# Patient Record
Sex: Male | Born: 1970 | Hispanic: Yes | Marital: Married | Smoking: Current every day smoker
Health system: Southern US, Community
[De-identification: ages and names within clinical notes are randomized; demographics above are authoritative.]

## PROBLEM LIST (undated history)

## (undated) DIAGNOSIS — I10 Essential (primary) hypertension: Secondary | ICD-10-CM

## (undated) DIAGNOSIS — E119 Type 2 diabetes mellitus without complications: Secondary | ICD-10-CM

## (undated) DIAGNOSIS — K219 Gastro-esophageal reflux disease without esophagitis: Secondary | ICD-10-CM

## (undated) HISTORY — PX: ABCESS DRAINAGE: SHX399

---

## 2015-08-30 ENCOUNTER — Emergency Department: Payer: Worker's Compensation

## 2015-08-30 ENCOUNTER — Encounter: Payer: Self-pay | Admitting: *Deleted

## 2015-08-30 ENCOUNTER — Emergency Department
Admission: EM | Admit: 2015-08-30 | Discharge: 2015-08-30 | Disposition: A | Discharge status: Home / Self Care | Payer: Worker's Compensation | Attending: Emergency Medicine | Admitting: Emergency Medicine

## 2015-08-30 DIAGNOSIS — Y998 Other external cause status: Secondary | ICD-10-CM | POA: Insufficient documentation

## 2015-08-30 DIAGNOSIS — F17210 Nicotine dependence, cigarettes, uncomplicated: Secondary | ICD-10-CM | POA: Diagnosis not present

## 2015-08-30 DIAGNOSIS — I10 Essential (primary) hypertension: Secondary | ICD-10-CM | POA: Diagnosis not present

## 2015-08-30 DIAGNOSIS — Y9389 Activity, other specified: Secondary | ICD-10-CM | POA: Insufficient documentation

## 2015-08-30 DIAGNOSIS — S42292A Other displaced fracture of upper end of left humerus, initial encounter for closed fracture: Secondary | ICD-10-CM | POA: Diagnosis not present

## 2015-08-30 DIAGNOSIS — S4992XA Unspecified injury of left shoulder and upper arm, initial encounter: Secondary | ICD-10-CM | POA: Diagnosis present

## 2015-08-30 DIAGNOSIS — E119 Type 2 diabetes mellitus without complications: Secondary | ICD-10-CM | POA: Insufficient documentation

## 2015-08-30 DIAGNOSIS — S59902A Unspecified injury of left elbow, initial encounter: Secondary | ICD-10-CM | POA: Diagnosis not present

## 2015-08-30 DIAGNOSIS — Y9289 Other specified places as the place of occurrence of the external cause: Secondary | ICD-10-CM | POA: Insufficient documentation

## 2015-08-30 DIAGNOSIS — W19XXXA Unspecified fall, initial encounter: Secondary | ICD-10-CM

## 2015-08-30 DIAGNOSIS — W000XXA Fall on same level due to ice and snow, initial encounter: Secondary | ICD-10-CM | POA: Diagnosis not present

## 2015-08-30 DIAGNOSIS — S42302A Unspecified fracture of shaft of humerus, left arm, initial encounter for closed fracture: Secondary | ICD-10-CM

## 2015-08-30 HISTORY — DX: Type 2 diabetes mellitus without complications: E11.9

## 2015-08-30 HISTORY — DX: Gastro-esophageal reflux disease without esophagitis: K21.9

## 2015-08-30 HISTORY — DX: Essential (primary) hypertension: I10

## 2015-08-30 MED ORDER — OXYCODONE-ACETAMINOPHEN 5-325 MG PO TABS
2.0000 | ORAL_TABLET | Freq: Once | ORAL | Status: AC
  Administered 2015-08-30: 2 via ORAL
  Filled 2015-08-30: qty 2

## 2015-08-30 MED ORDER — MORPHINE SULFATE (PF) 4 MG/ML IV SOLN
4.0000 mg | Freq: Once | INTRAVENOUS | Status: AC
  Administered 2015-08-30: 4 mg via INTRAVENOUS
  Filled 2015-08-30: qty 1

## 2015-08-30 MED ORDER — HYDROMORPHONE HCL 1 MG/ML IJ SOLN
1.0000 mg | Freq: Once | INTRAMUSCULAR | Status: AC
  Administered 2015-08-30: 1 mg via INTRAVENOUS
  Filled 2015-08-30: qty 1

## 2015-08-30 MED ORDER — OXYCODONE-ACETAMINOPHEN 5-325 MG PO TABS: 1.0000 | ORAL_TABLET | Freq: Four times a day (QID) | ORAL | Status: AC | PRN

## 2015-08-30 NOTE — ED Notes (Addendum)
Pt urine drug screen hand delivered to lab, and pt wc completed and copies given to pt for employer and himself.

## 2015-08-30 NOTE — ED Notes (Signed)
Pt arrived via EMS reporting reporting a fall on the ice today. Pt reports left sided shoulder pain and inability to move arm without 10/10 pain. Pt denies memory of hitting head and denies LOC. Pt denies pain anywhere other than shoulder at this time. Left shoulder appears to be higher than right and out of normal place.

## 2015-08-30 NOTE — ED Notes (Signed)
Radiology CD given to pt.

## 2015-08-30 NOTE — ED Notes (Signed)
Fax has failed again. Kathlene NovemberMike called for third time and reports no other fax line but that pts information can be emailed to [email or web address].

## 2015-08-30 NOTE — ED Provider Notes (Addendum)
[facility] Emergency Department Provider Note  Time seen: 11:31 AM  I have reviewed the triage vital signs and the nursing notes.   HISTORY  Chief Complaint Fall and Shoulder Pain    HPI Stanley Wilkinson is a 45 y.o. male with a past medical history of hypertension, diabetes, gastric reflux who presents the emergency department with left arm pain after fall. 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 Did not hit head. Denies LOC. Patient states immediate pain to the left shoulder and left elbow. Much worse with any attempted range of motion. Patient was able to get himself up, has been ambulatory since. He got back in his truck was able to drive to a safe location before calling EMS. Describes his pain as a 10/10. EMS dosed fentanyl, with some improvement per patient but remains in severe pain. Patient states the pain is mostly in the shoulder but does feel some discomfort in the elbow as well. Denies any other pain in his body. Denies headache, head pain, or neck or back pain.    Past Medical History  Diagnosis Date  . Hypertension   . Diabetes mellitus without complication (HCC)   . GERD (gastroesophageal reflux disease)     There are no active problems to display for this patient.   Past Surgical History  Procedure Laterality Date  . Abcess drainage      No current outpatient prescriptions on file.  Allergies Review of patient's allergies indicates not on file.  History reviewed. No pertinent family history.  Social History Social History  Substance Use Topics  . Smoking status: Current Every Day Smoker -- 0.50 packs/day    Types: Cigarettes  . Smokeless tobacco: None  . Alcohol Use: Yes     Comment: occasionally    Review of Systems Constitutional: Negative for fever Cardiovascular: Negative for chest pain. Respiratory: Negative for shortness of  breath. Gastrointestinal: Negative for abdominal pain Musculoskeletal: Negative for neck or back pain. Positive for left shoulder/left elbow pain Neurological: Negative for headache 10-point ROS otherwise negative.  ____________________________________________   PHYSICAL EXAM:  VITAL SIGNS: ED Triage Vitals  Enc Vitals Group     BP 08/30/15 1128 154/128 mmHg     Pulse Rate 08/30/15 1128 88     Resp 08/30/15 1128 16     Temp 08/30/15 1128 98.2 F (36.8 C)     Temp Source 08/30/15 1128 Oral     SpO2 08/30/15 1128 98 %     Weight 08/30/15 1128 213 lb (96.616 kg)     Height 08/30/15 1128 5\' 5"  (1.651 m)     Head Cir --      Peak Flow --      Pain Score 08/30/15 1129 5     Pain Loc --      Pain Edu? --      Excl. in GC? --     Constitutional: Alert and oriented. Well appearing and in mild distress due to pain Eyes: Normal exam ENT   Head: Normocephalic and atraumatic   Mouth/Throat: Mucous membranes are moist. Cardiovascular: Normal rate, regular rhythm.  Respiratory: Normal respiratory effort without tachypnea nor retractions. Breath sounds are clear and equal bilaterally. No wheezes/rales/rhonchi. Gastrointestinal: Soft and nontender. No distention.   Musculoskeletal: Significant tenderness to palpation of the left shoulder with moderate swelling noted. Mild tenderness palpation of left elbow. Neurovascularly intact distally with 2+ radial  pulse. Extremities are otherwise atraumatic. No C-spine tenderness. No other traumatic injuries identified. Neurologic:  Normal speech and language. No gross focal neurologic deficits  Skin:  Skin is warm, dry and intact.  Psychiatric: Mood and affect are normal. Speech and behavior are normal.   ____________________________________________    RADIOLOGY  Proximal humerus fracture. Posterior fat pad sign.  ____________________________________________   INITIAL IMPRESSION / ASSESSMENT AND PLAN / ED COURSE  Pertinent labs &  imaging results that were available during my care of the patient were reviewed by me and considered in my medical decision making (see chart for details).  Patient presents to the emergency department after a fall. Patient states significant left shoulder, with mild left elbow pain. No other injuries identified on exam. Patient denies pain in any other location. Patient has been ambulatory since the fall without difficulty. We will check x-rays and closely monitor in the emergency department. We will dose Dilaudid for pain control.  X-ray consistent with proximal humerus fracture, possible elbow fracture. We'll place in a splint and sling and have the patient follow with orthopedist [place] to follow-up with the orthopedist.  [facility] with pain medication. 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 He states he will either take the bus or fly home.  Arm remains neurovascularly intact status post splint. Patient states he feels much better after the splint was applied. We'll discharge a Percocet he will [place]. We have faxed a note explaining his symptoms and a fracture to his employer who will arrange [place]. ____________________________________________   FINAL CLINICAL IMPRESSION(S) / ED DIAGNOSES  Fall Left shoulder pain Possible humerus fracture  Minna AntisKevin Ivette Castronova, MD 08/30/15 1315  Minna AntisKevin Alexxia Stankiewicz, MD 08/30/15 1419

## 2015-08-30 NOTE — ED Notes (Signed)
This RN spoke to [employer] Brown regarding pts Workers comp. Complaint. Barbara CowerJason transferred RN to Pollie FriarMike Barker in [employer] who reported pt needed a urine drug screen for their workers comp. Pollie FriarMike Barker can be reached at phone: [phone] or fax: [phone]. Kathlene NovemberMike spoke to Viviano SimasLiz Gannon about DOT drug screens and it has been decided that since pt was not driving at the time a simple urine drug screen will be appropriate.

## 2015-08-30 NOTE — ED Notes (Signed)
Patient transported to X-ray 

## 2015-08-30 NOTE — Discharge Instructions (Signed)
Please follow-up with orthopedics as soon as possible for further evaluation.  Return to the emergency department for increased pain, numbness or coldness of the extremity, or any other symptom [place] concerning to yourself.    Humerus Fracture Treated With Immobilization The humerus is the large bone in your upper arm. You have a broken (fractured) humerus. These fractures are easily diagnosed with X-rays. TREATMENT  Simple fractures which will heal without disability are treated with simple immobilization. Immobilization means you will wear a cast, splint, or sling. You have a fracture which will do well with immobilization. The fracture will heal well simply by being held in a good position until it is stable enough to begin range of motion exercises. Do not take part in activities which would further injure your arm.  HOME CARE INSTRUCTIONS   Put ice on the injured area.  Put ice in a plastic bag.  Place a towel between your skin and the bag.  Leave the ice on for 15-20 minutes, 03-04 times a day.  If you have a cast:  Do not scratch the skin under the cast using sharp or pointed objects.  Check the skin around the cast every day. You may put lotion on any red or sore areas.  Keep your cast dry and clean.  If you have a splint:  Wear the splint as directed.  Keep your splint dry and clean.  You may loosen the elastic around the splint if your fingers become numb, tingle, or turn cold or blue.  If you have a sling:  Wear the sling as directed.  Do not put pressure on any part of your cast or splint until it is fully hardened.  Your cast or splint can be protected during bathing with a plastic bag. Do not lower the cast or splint into water.  Only take over-the-counter or prescription medicines for pain, discomfort, or fever as directed by your caregiver.  Do range of motion exercises as instructed by your caregiver.  Follow up as directed by your caregiver. This is  very important in order to avoid permanent injury or disability and chronic pain. SEEK IMMEDIATE MEDICAL CARE IF:   Your skin or nails in the injured arm turn blue or gray.  Your arm feels cold or numb.  You develop severe pain in the injured arm.  You are having problems with the medicines you were given. MAKE SURE YOU:   Understand these instructions.  Will watch your condition.  Will get help right away if you are not doing well or get worse.   This information is not intended to replace advice given to you by your health care provider. Make sure you discuss any questions you have with your health care provider.   Document Released: 11/12/2000 Document Revised: 08/27/2014 Document Reviewed: 12/29/2014 Elsevier Interactive Patient Education [employer]

## 2017-10-01 IMAGING — CR DG ELBOW 2V*L*
2 series · 2 of 2 positions shown · non-contrast
Comparison: None.

CLINICAL DATA: Fall on ice today

EXAM:
LEFT ELBOW - 2 VIEW

[elbow ap]
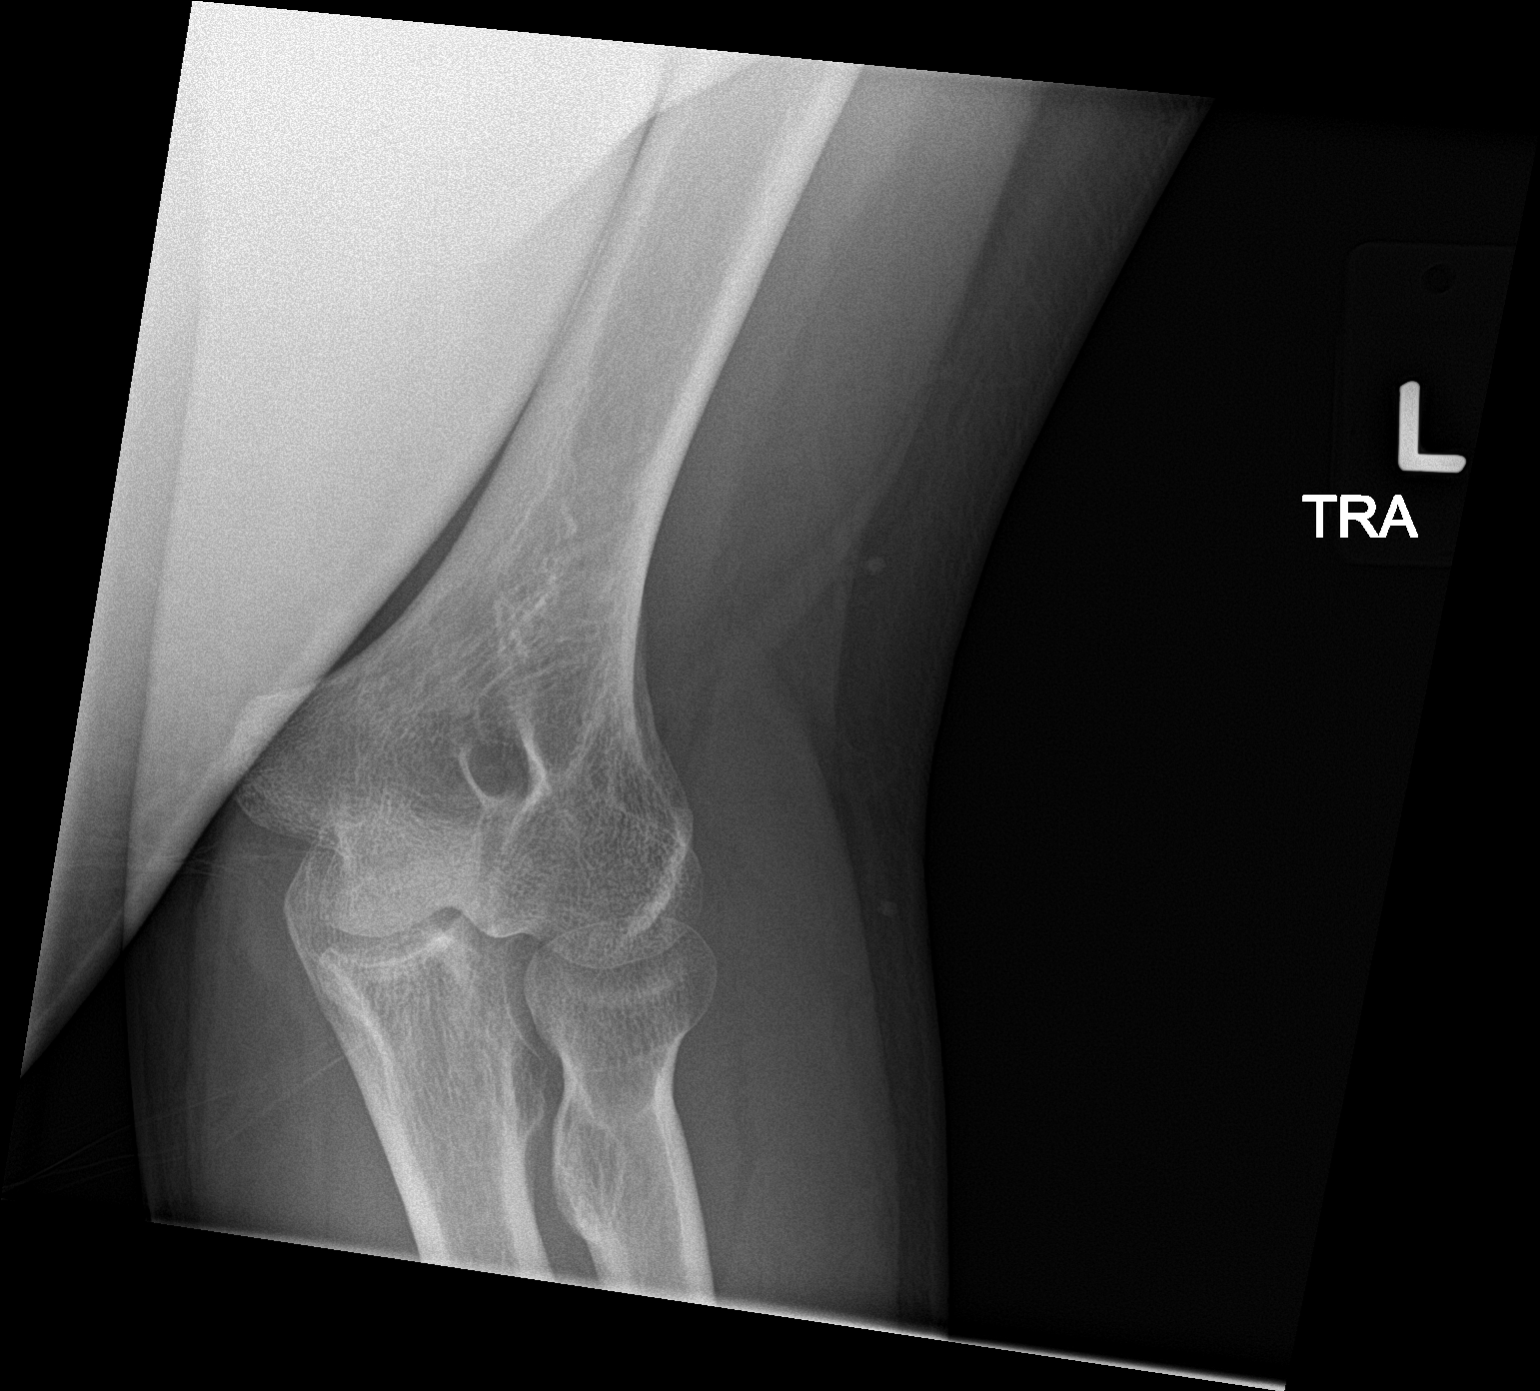

[elbow lat]
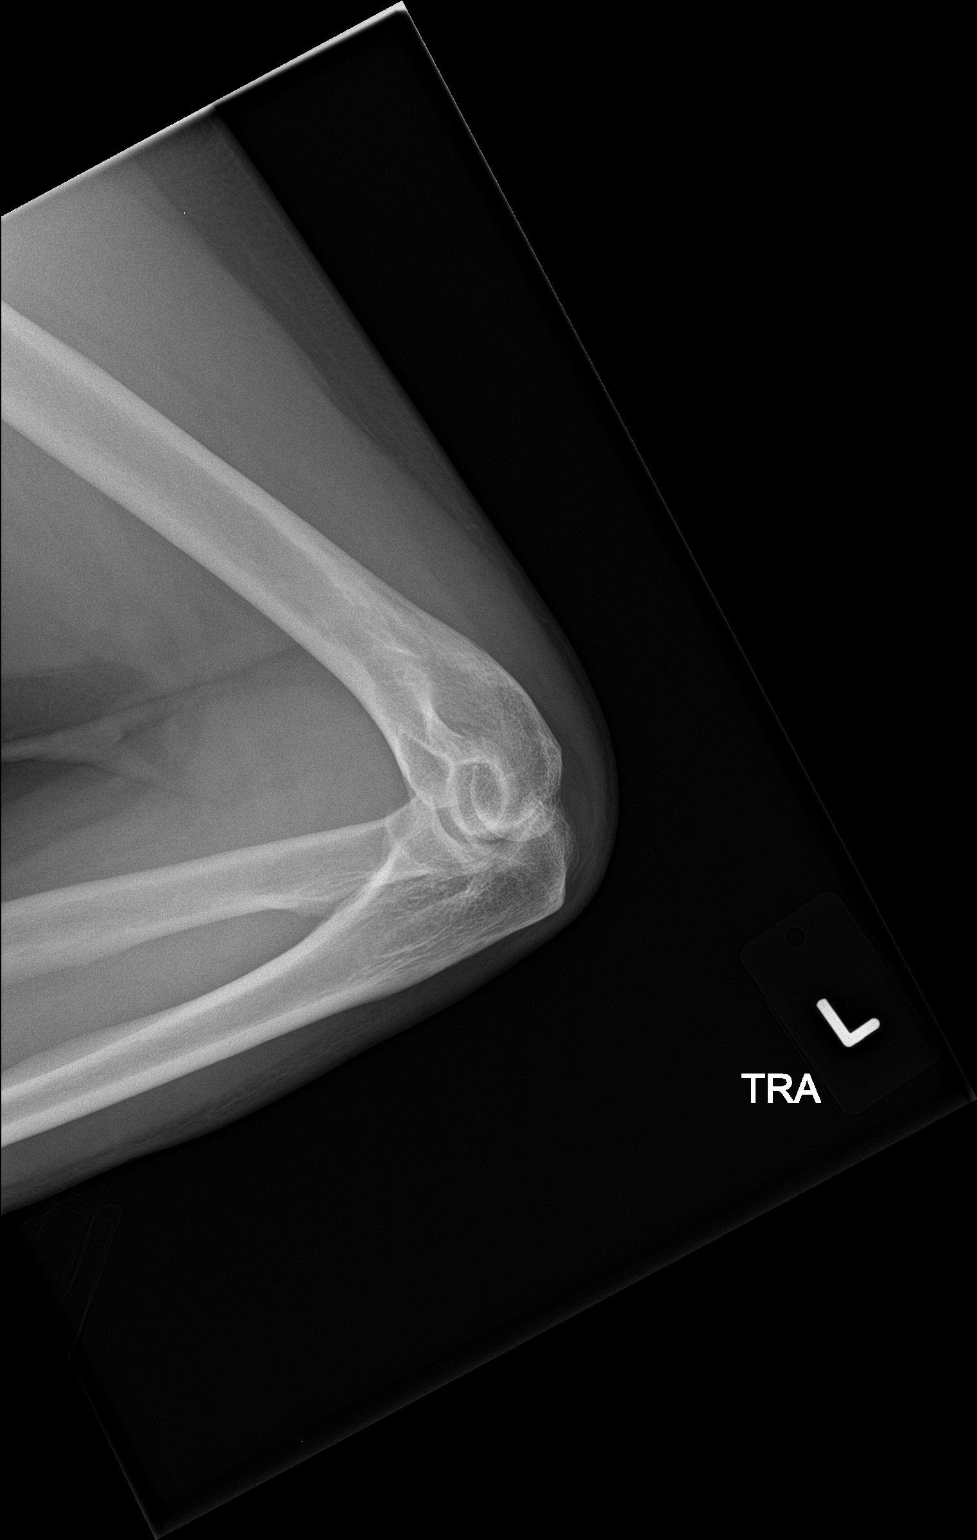

[2 of 2 positions shown; findings below may reference images not displayed]

FINDINGS: Two views of the left elbow submitted. The study is limited with
only two views provided. There is positive posterior fat pad sign
due to joint effusion. On AP view there is subtle lucent line radial
head. Subtle nondisplaced fracture cannot be excluded. Clinical
correlation is necessary. No elbow dislocation.
IMPRESSION: The study is limited with only two views provided. There is positive
posterior fat pad sign due to joint effusion. On AP view there is
subtle lucent line radial head. Subtle nondisplaced fracture cannot
be excluded. Clinical correlation is necessary. No elbow
dislocation.
